# Patient Record
Sex: Female | Born: 1970 | Race: White | Hispanic: No | Marital: Married
Health system: Southern US, Community
[De-identification: ages and names within clinical notes are randomized; demographics above are authoritative.]

## PROBLEM LIST (undated history)

## (undated) DIAGNOSIS — I10 Essential (primary) hypertension: Secondary | ICD-10-CM

## (undated) HISTORY — PX: CHOLECYSTECTOMY: SHX55

---

## 2019-12-09 ENCOUNTER — Encounter (HOSPITAL_COMMUNITY): Payer: Self-pay

## 2019-12-09 ENCOUNTER — Emergency Department (HOSPITAL_COMMUNITY)
Admission: EM | Admit: 2019-12-09 | Discharge: 2019-12-09 | Disposition: A | Discharge status: Home / Self Care | Payer: 59 | Attending: Emergency Medicine | Admitting: Emergency Medicine

## 2019-12-09 ENCOUNTER — Other Ambulatory Visit: Payer: Self-pay

## 2019-12-09 ENCOUNTER — Emergency Department (HOSPITAL_COMMUNITY): Payer: 59

## 2019-12-09 DIAGNOSIS — I10 Essential (primary) hypertension: Secondary | ICD-10-CM | POA: Diagnosis not present

## 2019-12-09 DIAGNOSIS — S0990XA Unspecified injury of head, initial encounter: Secondary | ICD-10-CM | POA: Diagnosis present

## 2019-12-09 DIAGNOSIS — Y999 Unspecified external cause status: Secondary | ICD-10-CM | POA: Insufficient documentation

## 2019-12-09 DIAGNOSIS — Y929 Unspecified place or not applicable: Secondary | ICD-10-CM | POA: Diagnosis not present

## 2019-12-09 DIAGNOSIS — Z79899 Other long term (current) drug therapy: Secondary | ICD-10-CM | POA: Diagnosis not present

## 2019-12-09 DIAGNOSIS — M25512 Pain in left shoulder: Secondary | ICD-10-CM | POA: Diagnosis not present

## 2019-12-09 DIAGNOSIS — W108XXA Fall (on) (from) other stairs and steps, initial encounter: Secondary | ICD-10-CM | POA: Insufficient documentation

## 2019-12-09 DIAGNOSIS — S060X0A Concussion without loss of consciousness, initial encounter: Secondary | ICD-10-CM | POA: Insufficient documentation

## 2019-12-09 DIAGNOSIS — Y939 Activity, unspecified: Secondary | ICD-10-CM | POA: Diagnosis not present

## 2019-12-09 HISTORY — DX: Essential (primary) hypertension: I10

## 2019-12-09 NOTE — ED Triage Notes (Addendum)
Pt arrived via EMS from home after falling down six steps while carrying a printer onto gravel hitting her face. Hematoma noted to left forehead, pt denies loc or blod thinner use. C-collar in place. 1liter of ns given per EMS as their first bp recorded was 90 palp. Pt c/o left shoulder pain.

## 2019-12-09 NOTE — ED Provider Notes (Signed)
[facility] EMERGENCY DEPARTMENT Provider Note   CSN: [id number] Arrival date & time: 12/09/19  1716     History Chief Complaint  Patient presents with  . Fall    Lori Holt is a 49 y.o. female.  The history is provided by the patient. No language interpreter was used.  Fall This is a new problem. The current episode started 1 to 2 hours ago. The problem occurs constantly. The problem has not changed since onset.Associated symptoms include headaches. Nothing aggravates the symptoms. Nothing relieves the symptoms. She has tried nothing for the symptoms. The treatment provided no relief.   Pt reports she fell down the stairs and hit her head. No loss of consciousness. . Pt reports she was dizzy and nauseated with standing.      Past Medical History:  Diagnosis Date  . Hypertension     There are no problems to display for this patient.      OB History   No obstetric history on file.     No family history on file.  Social History   Tobacco Use  . Smoking status: Not on file  Substance Use Topics  . Alcohol use: Not on file  . Drug use: Not on file    Home Medications Prior to Admission medications   Medication Sig Start Date End Date Taking? Authorizing Provider  acetaminophen (TYLENOL) 325 MG tablet Take 650 mg by mouth every 6 (six) hours as needed for mild pain.   Yes [provider]  lisinopril (ZESTRIL) 5 MG tablet Take 5 mg by mouth daily. 11/10/19  Yes [provider]    Allergies    Escitalopram oxalate and Penicillins  Review of Systems   Review of Systems  Skin: Positive for wound.  Neurological: Positive for headaches.  All other systems reviewed and are negative.   Physical Exam Updated Vital Signs BP (!) 142/89   Pulse 75   Temp 97.7 F (36.5 C) (Oral)   Resp 14   Ht 5\' 3"  (1.6 m)   Wt 83.9 kg   SpO2 99%   BMI 32.77 kg/m   Physical Exam Vitals and nursing note reviewed.    Constitutional:      Appearance: She is well-developed.  HENT:     Head: Normocephalic.     Right Ear: Tympanic membrane normal.     Left Ear: Tympanic membrane normal.     Mouth/Throat:     Mouth: Mucous membranes are moist.  Eyes:     Pupils: Pupils are equal, round, and reactive to light.  Cardiovascular:     Rate and Rhythm: Normal rate.     Pulses: Normal pulses.  Pulmonary:     Effort: Pulmonary effort is normal.  Abdominal:     General: Abdomen is flat. There is no distension.  Musculoskeletal:        General: Swelling and tenderness present.     Cervical back: Normal range of motion.     Comments: Left shoulder diffusely tender  From nv and  ns intact   Skin:    General: Skin is warm.  Neurological:     General: No focal deficit present.     Mental Status: She is alert and oriented to person, place, and time.  Psychiatric:        Mood and Affect: Mood normal.     ED Results / Procedures / Treatments   Labs (all labs ordered are listed, but only abnormal results are displayed)  Labs Reviewed - No data to display  EKG None  Radiology CT Head Wo Contrast  Result Date: 12/09/2019 CLINICAL DATA:  Head trauma EXAM: CT HEAD WITHOUT CONTRAST TECHNIQUE: Contiguous axial images were obtained from the base of the skull through the vertex without intravenous contrast. COMPARISON:  None. FINDINGS: Brain: No acute intracranial abnormality. Specifically, no hemorrhage, hydrocephalus, mass lesion, acute infarction, or significant intracranial injury. Vascular: No hyperdense vessel or unexpected calcification. Skull: No acute calvarial abnormality. Sinuses/Orbits: Visualized paranasal sinuses and mastoids clear. Orbital soft tissues unremarkable. Other: None IMPRESSION: Normal study. Electronically Signed   By: Charlett Nose M.D.   On: 12/09/2019 18:43   CT Cervical Spine Wo Contrast  Result Date: 12/09/2019 CLINICAL DATA:  Neck trauma. EXAM: CT CERVICAL SPINE WITHOUT CONTRAST  TECHNIQUE: Multidetector CT imaging of the cervical spine was performed without intravenous contrast. Multiplanar CT image reconstructions were also generated. COMPARISON:  None. FINDINGS: Alignment: Normal Skull base and vertebrae: No acute fracture. No primary bone lesion or focal pathologic process. Soft tissues and spinal canal: No prevertebral fluid or swelling. No visible canal hematoma. Disc levels:  Disc space narrowing at C5-6. Upper chest: 3 mm posterior subpleural nodule in the right apex. Other: None IMPRESSION: No acute bony abnormality in the cervical spine. 3 mm subpleural nodule in the right apex. No follow-up needed if patient is low-risk. Non-contrast chest CT can be considered in 12 months if patient is high-risk. This recommendation follows the consensus statement: Guidelines for Management of Incidental Pulmonary Nodules Detected on CT Images: From the Fleischner Society 2017; Radiology 2017; 284:228-243. Electronically Signed   By: Charlett Nose M.D.   On: 12/09/2019 18:46   DG Shoulder Left  Result Date: 12/09/2019 CLINICAL DATA:  Fall, left shoulder pain EXAM: LEFT SHOULDER - 2+ VIEW COMPARISON:  None. FINDINGS: Early degenerative changes in the left [facility] joint with joint space narrowing and spurring. Glenohumeral joint is maintained. No acute bony abnormality. Specifically, no fracture, subluxation, or dislocation. 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 No acute bony abnormality. Electronically Signed   By: Charlett Nose M.D.   On: 12/09/2019 19:03    Procedures Procedures (including critical care time)  Medications Ordered in ED Medications - No data to display  ED Course  I have reviewed the triage vital signs and the nursing notes.  Pertinent labs & imaging results that were available during my care of the patient were reviewed by me and considered in my medical decision making (see chart for details).    MDM Rules/Calculators/A&P                           MDM:  Ct head and c spine negative,  Shoulder arthritis no fracture  Final Clinical Impression(s) / ED Diagnoses Final diagnoses:  Concussion without loss of consciousness, initial encounter  Acute pain of left shoulder    Rx / DC Orders ED Discharge Orders    None    An After Visit Summary was printed and given to the patient.    Osie Cheeks 12/09/19 1954    Pollyann Savoy, MD 12/09/19 2159

## 2019-12-09 NOTE — Discharge Instructions (Signed)
Return if any problems.  See your Physician for recheck  °

## 2020-12-25 IMAGING — CT CT CERVICAL SPINE W/O CM
3 of 4 series · 13 of 33 positions shown, 16 images · non-contrast
Comparison: None.

CLINICAL DATA: Neck trauma.

EXAM:
CT CERVICAL SPINE WITHOUT CONTRAST
TECHNIQUE: Multidetector CT imaging of the cervical spine was performed without
intravenous contrast. Multiplanar CT image reconstructions were also
generated.

[Series 4: c_spine 2.0 st · axial · 0.34mm/px · z∈[-345,-219]mm · 5 of 95 slices shown, 7 images]
[im 16/95  soft-tissue]
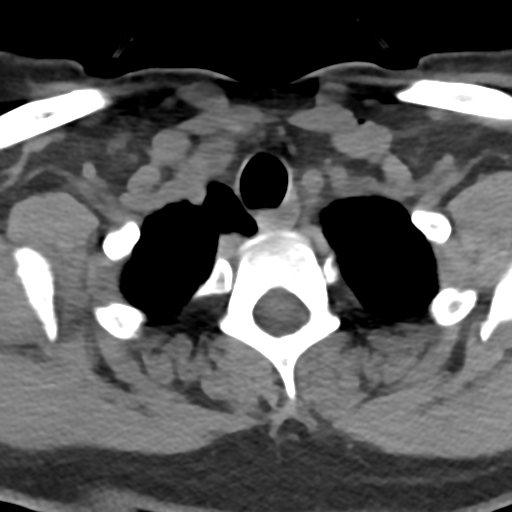
[im 16/95  bone]
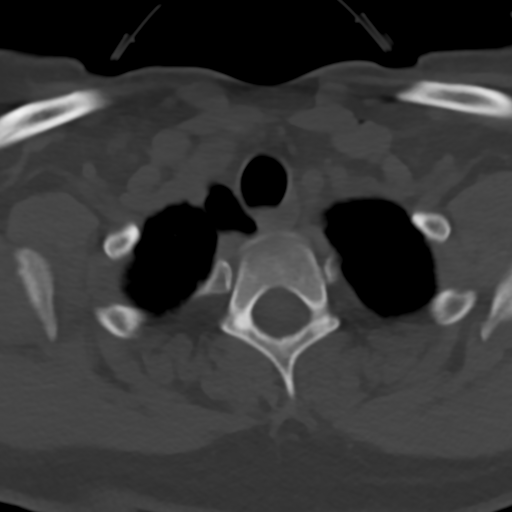
[im 32/95  bone]
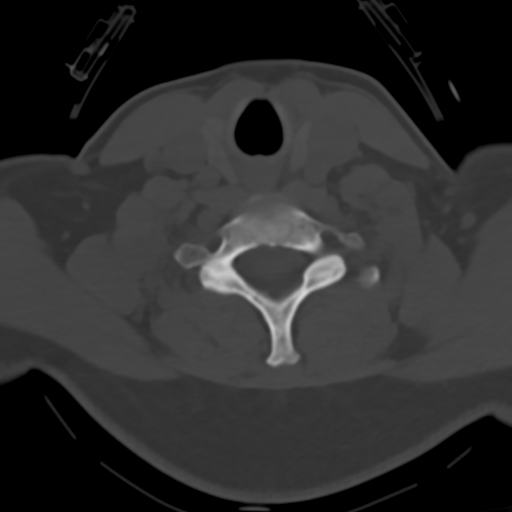
[im 48/95  bone]
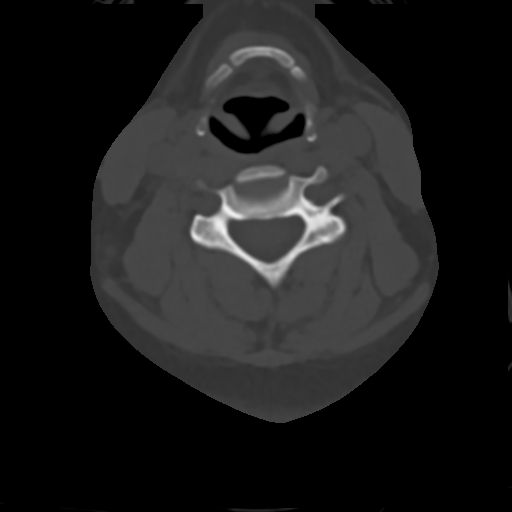
[im 63/95  bone]
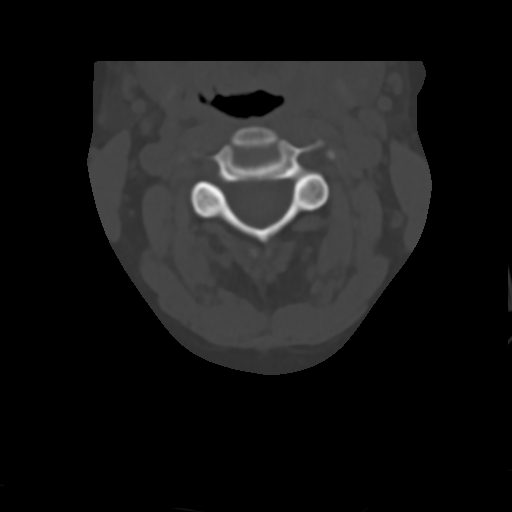
[im 79/95  soft-tissue]
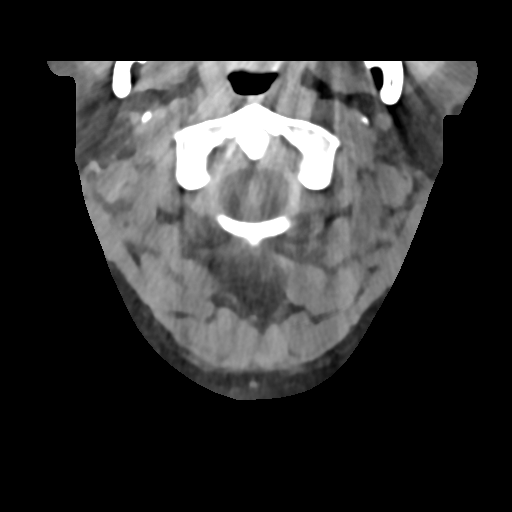
[im 79/95  bone]
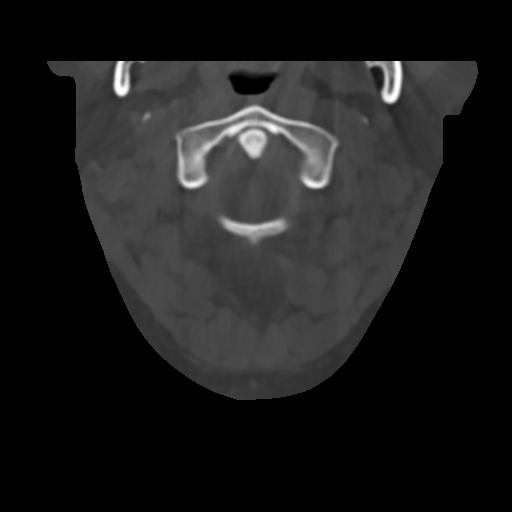

[Series 9: c_spine 2.0 sag bone · sagittal · 0.34mm/px · 5 of 70 slices shown, 6 images]
[im 24/70  bone]
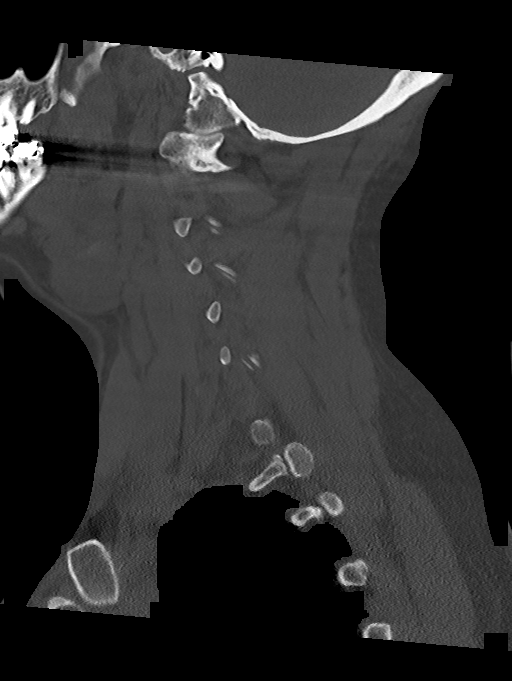
[im 29/70  bone]
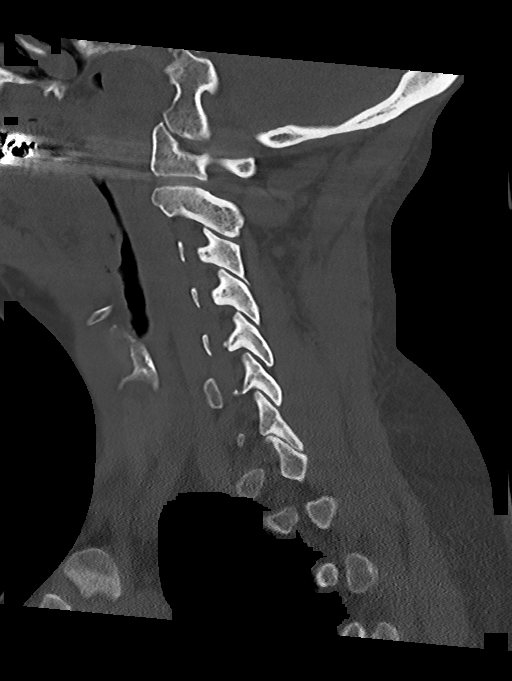
[im 35/70  soft-tissue]
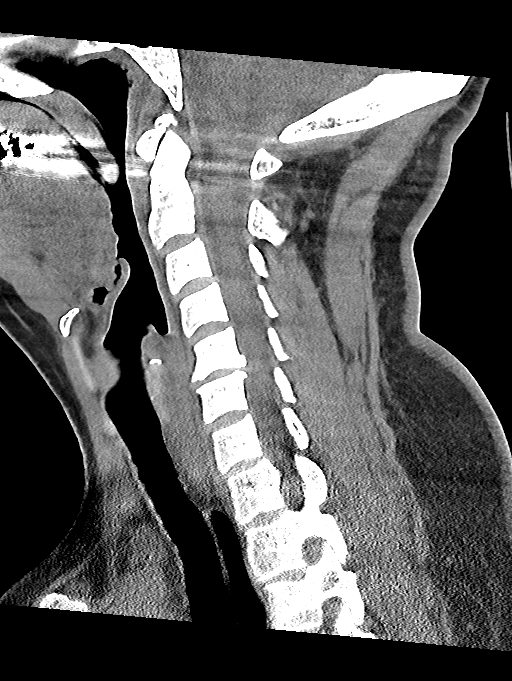
[im 35/70  bone]
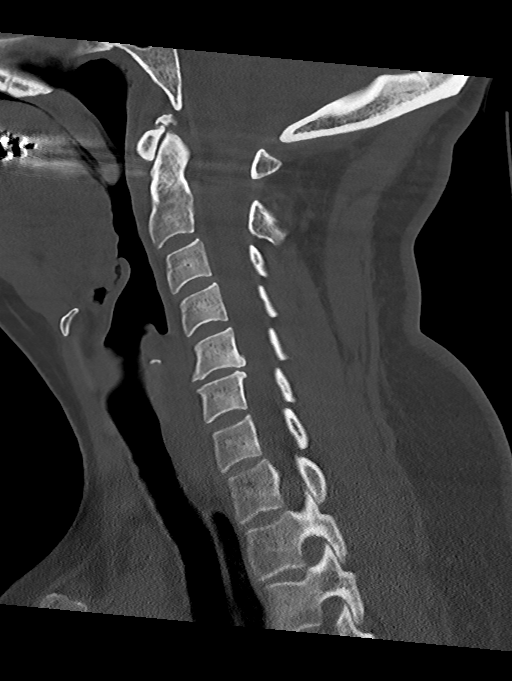
[im 41/70  bone]
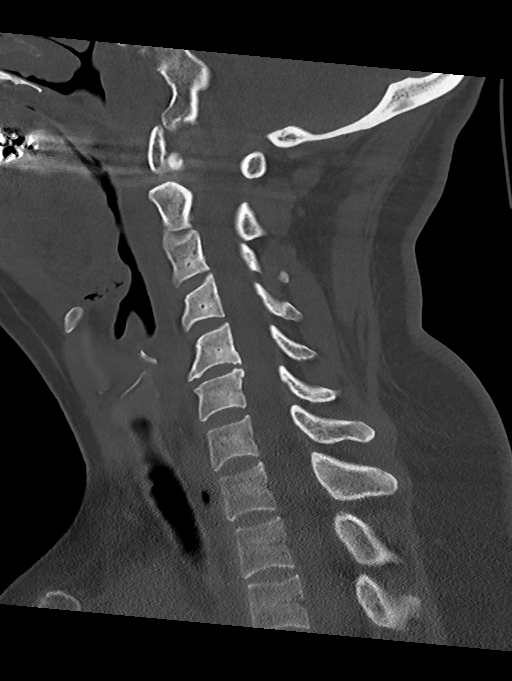
[im 47/70  bone]
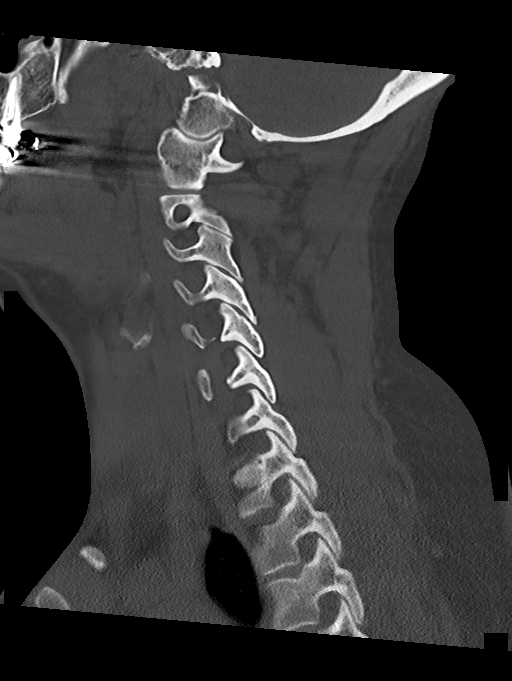

[Series 10: c_spine 2.0 cor bone · coronal · 0.34mm/px · 3 of 92 slices shown]
[im 19/92  bone]
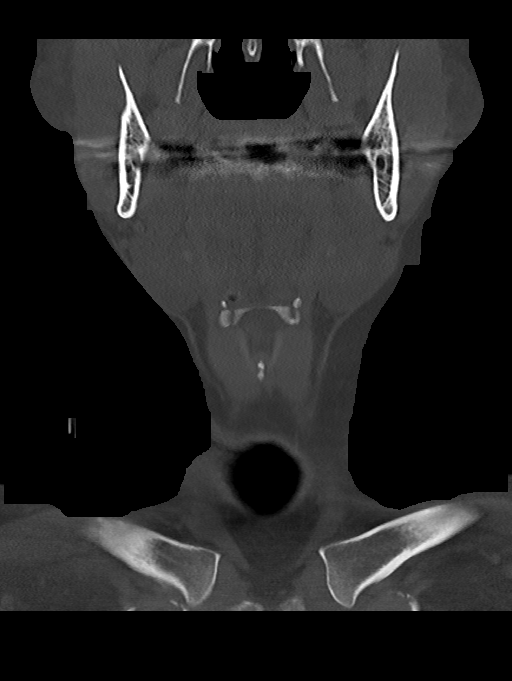
[im 37/92  bone]
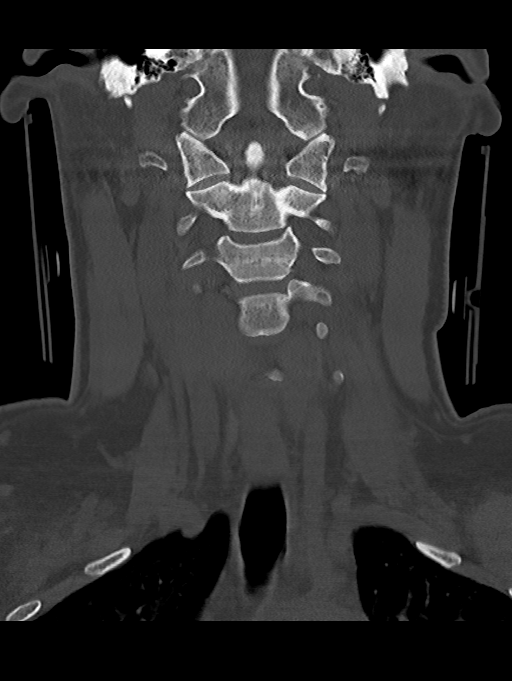
[im 55/92  bone]
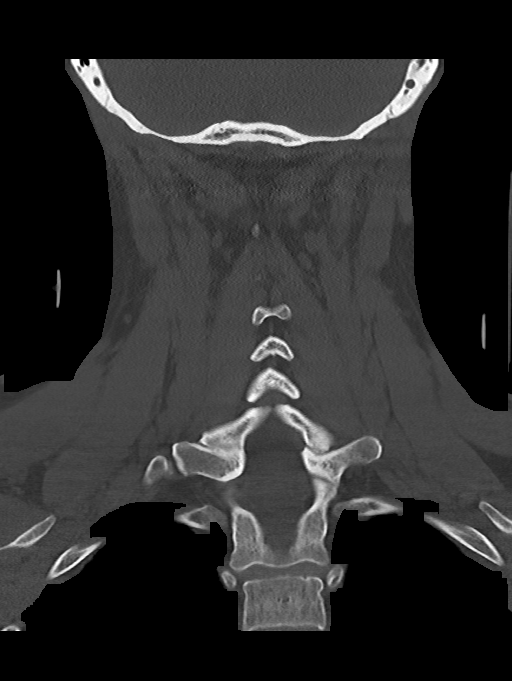

[13 of 33 positions shown; findings below may reference images not displayed]

FINDINGS: Alignment: Normal

Skull base and vertebrae: No acute fracture. No primary bone lesion
or focal pathologic process.

Soft tissues and spinal canal: No prevertebral fluid or swelling. No
visible canal hematoma.

Disc levels:  Disc space narrowing at C5-6.

Upper chest: 3 mm posterior subpleural nodule in the right apex.

Other: None
IMPRESSION: No acute bony abnormality in the cervical spine.

3 mm subpleural nodule in the right apex. No follow-up needed if
patient is low-risk. Non-contrast chest CT can be considered in 12
months if patient is high-risk. This recommendation follows the
consensus statement: Guidelines for Management of Incidental
Pulmonary Nodules Detected on CT Images: From the [HOSPITAL]

## 2020-12-25 IMAGING — CR DG SHOULDER 2+V*L*
2 series · 2 of 2 positions shown · non-contrast
Comparison: None.

CLINICAL DATA: Fall, left shoulder pain

EXAM:
LEFT SHOULDER - 2+ VIEW

[shoulder grashey]
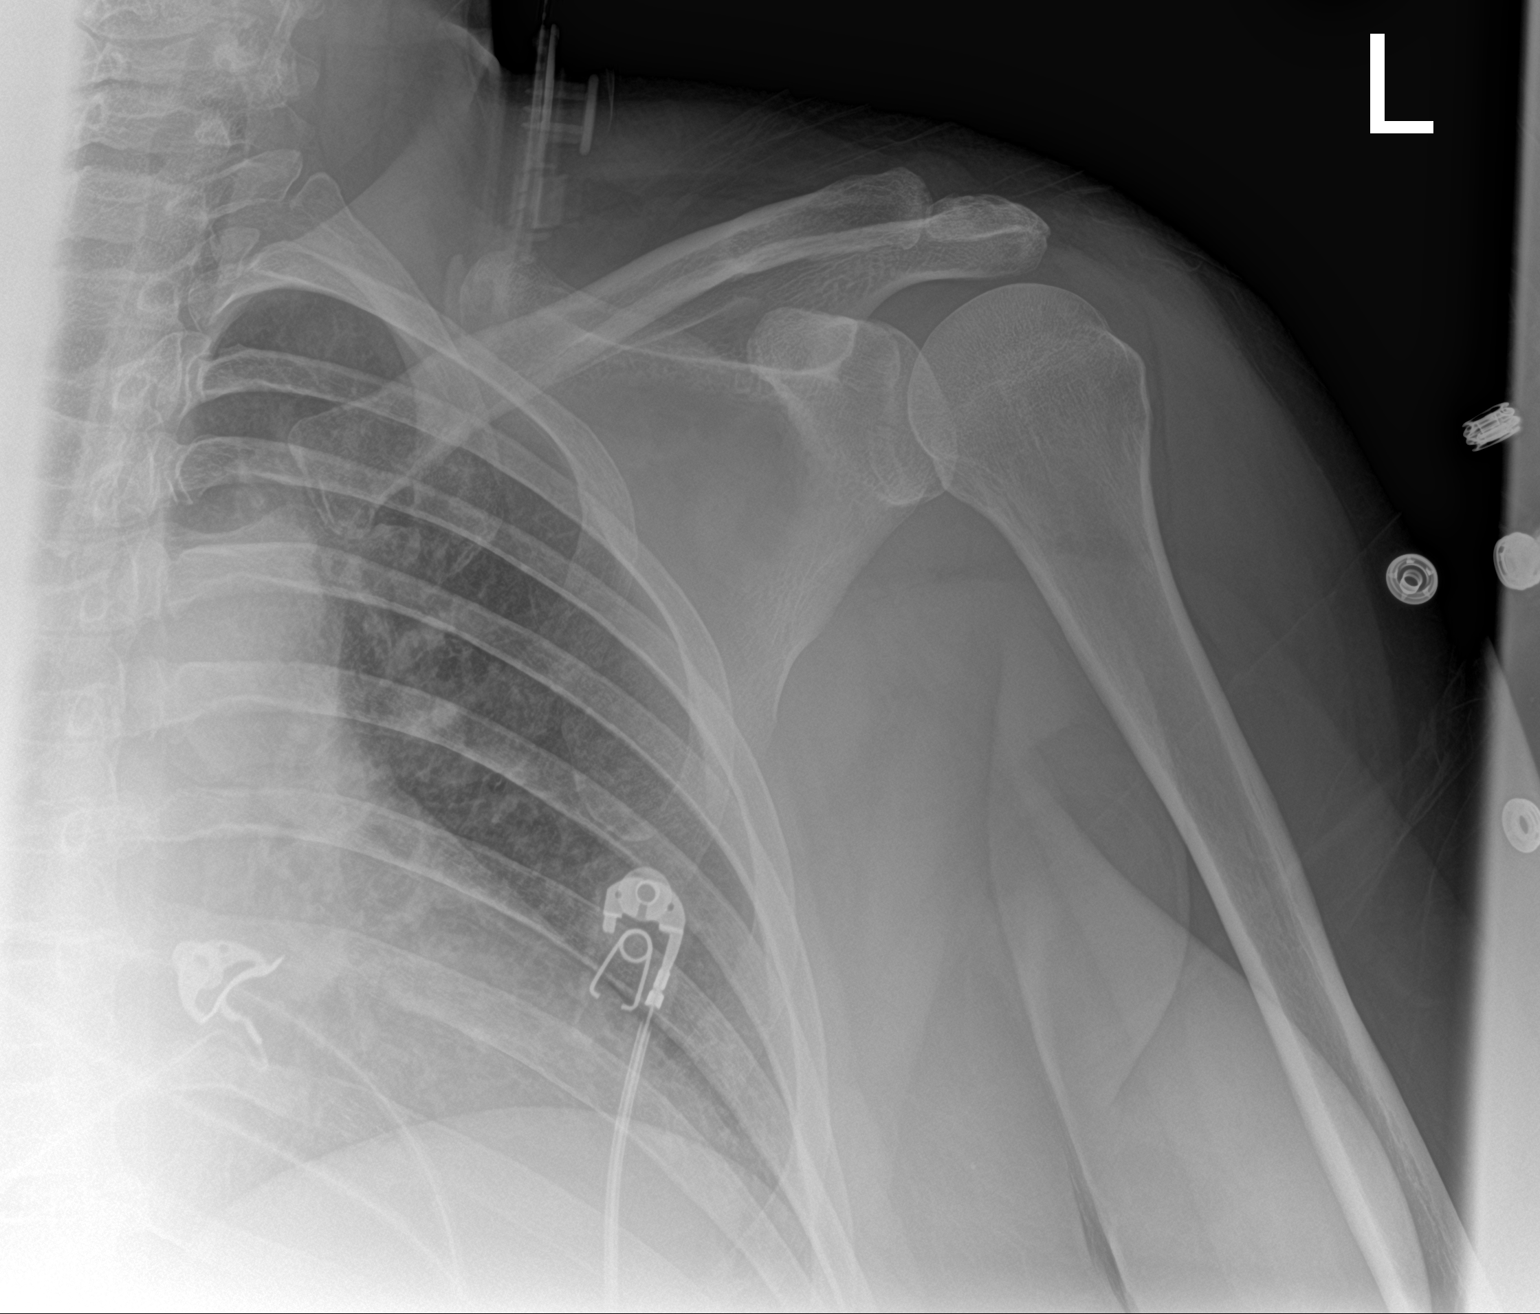

[shoulder y view]
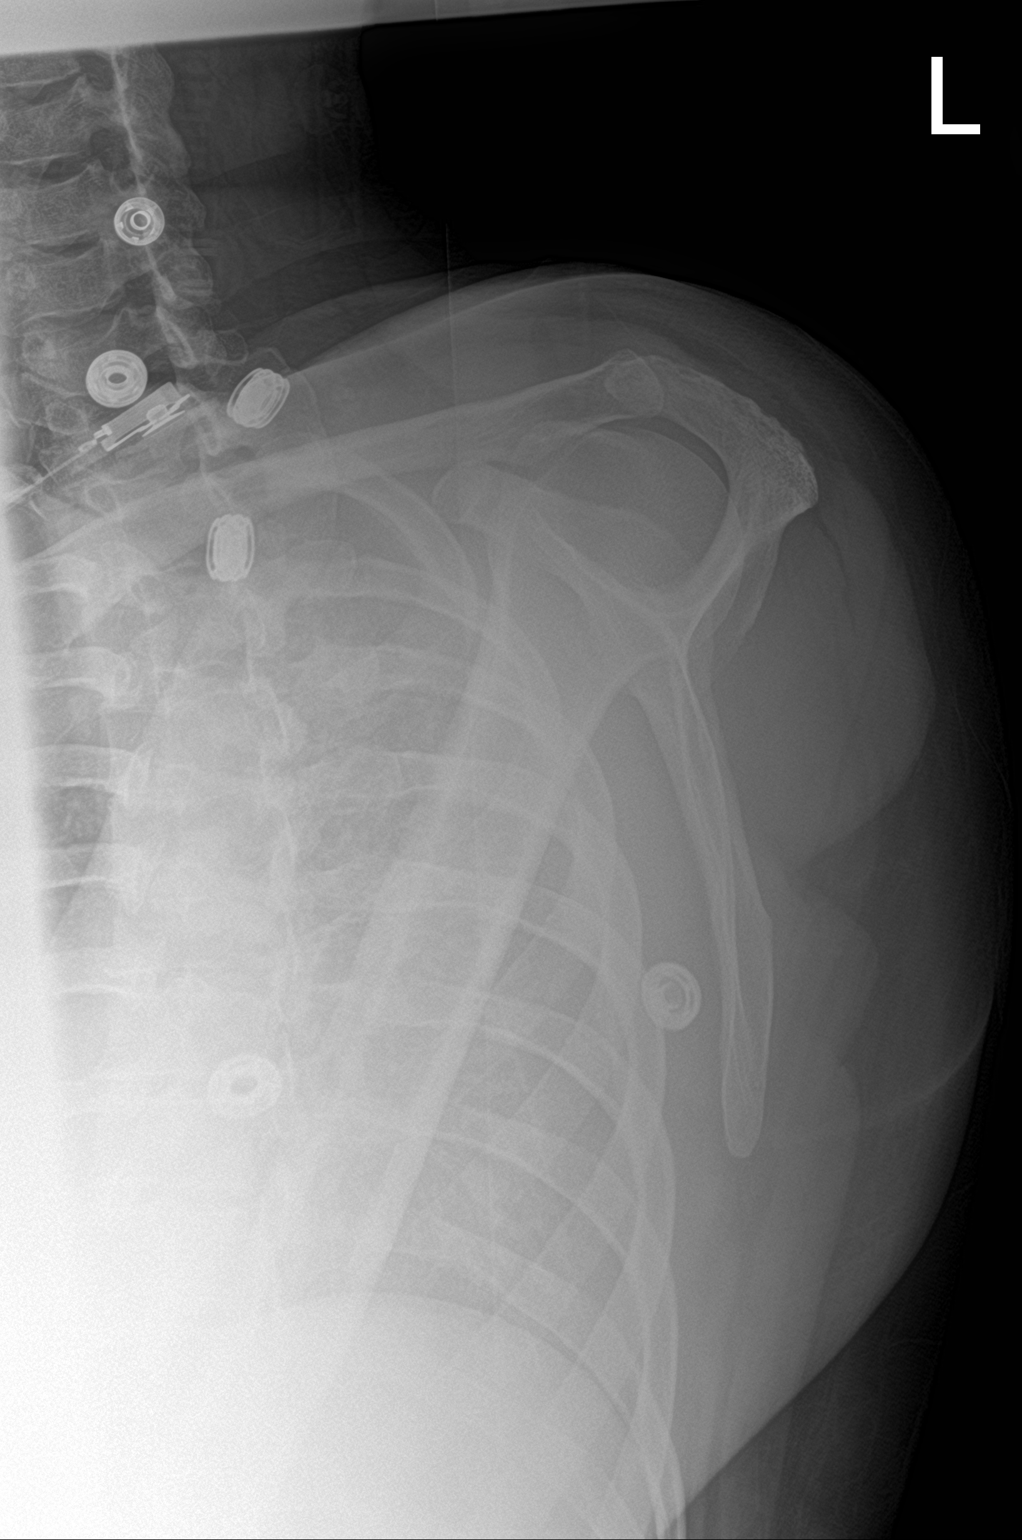

[2 of 2 positions shown; findings below may reference images not displayed]

FINDINGS: Early degenerative changes in the left AC joint with joint space
narrowing and spurring. Glenohumeral joint is maintained. No acute
bony abnormality. Specifically, no fracture, subluxation, or
dislocation.
IMPRESSION: Early osteoarthritis in the left AC joint. No acute bony
abnormality.

## 2023-03-29 ENCOUNTER — Other Ambulatory Visit: Payer: Self-pay

## 2023-03-29 ENCOUNTER — Emergency Department (HOSPITAL_BASED_OUTPATIENT_CLINIC_OR_DEPARTMENT_OTHER)
Admission: EM | Admit: 2023-03-29 | Discharge: 2023-03-30 | Disposition: A | Discharge status: Home / Self Care | Payer: Managed Care, Other (non HMO) | Attending: Emergency Medicine | Admitting: Emergency Medicine

## 2023-03-29 ENCOUNTER — Encounter (HOSPITAL_BASED_OUTPATIENT_CLINIC_OR_DEPARTMENT_OTHER): Payer: Self-pay

## 2023-03-29 ENCOUNTER — Emergency Department (HOSPITAL_BASED_OUTPATIENT_CLINIC_OR_DEPARTMENT_OTHER): Payer: Managed Care, Other (non HMO)

## 2023-03-29 DIAGNOSIS — R1084 Generalized abdominal pain: Secondary | ICD-10-CM | POA: Diagnosis not present

## 2023-03-29 DIAGNOSIS — Z79899 Other long term (current) drug therapy: Secondary | ICD-10-CM | POA: Diagnosis not present

## 2023-03-29 DIAGNOSIS — I10 Essential (primary) hypertension: Secondary | ICD-10-CM | POA: Diagnosis not present

## 2023-03-29 DIAGNOSIS — N838 Other noninflammatory disorders of ovary, fallopian tube and broad ligament: Secondary | ICD-10-CM

## 2023-03-29 DIAGNOSIS — R1011 Right upper quadrant pain: Secondary | ICD-10-CM | POA: Insufficient documentation

## 2023-03-29 DIAGNOSIS — N839 Noninflammatory disorder of ovary, fallopian tube and broad ligament, unspecified: Secondary | ICD-10-CM | POA: Insufficient documentation

## 2023-03-29 LAB — COMPREHENSIVE METABOLIC PANEL
ALT: 21 U/L (ref 0–44)
AST: 25 U/L (ref 15–41)
Albumin: 4 g/dL (ref 3.5–5.0)
Alkaline Phosphatase: 91 U/L (ref 38–126)
Anion gap: 9 (ref 5–15)
BUN: 10 mg/dL (ref 6–20)
CO2: 23 mmol/L (ref 22–32)
Calcium: 9 mg/dL (ref 8.9–10.3)
Chloride: 104 mmol/L (ref 98–111)
Creatinine, Ser: 0.98 mg/dL (ref 0.44–1.00)
GFR, Estimated: >60 mL/min (ref >60)
Glucose, Bld: 134 mg/dL — ABNORMAL HIGH (ref 70–99)
Potassium: 3.4 mmol/L — ABNORMAL LOW (ref 3.5–5.1)
Sodium: 136 mmol/L (ref 135–145)
Total Bilirubin: 0.7 mg/dL (ref <1.2)
Total Protein: 7.2 g/dL (ref 6.5–8.1)

## 2023-03-29 LAB — URINALYSIS, ROUTINE W REFLEX MICROSCOPIC
Bilirubin Urine: NEGATIVE
Glucose, UA: NEGATIVE mg/dL
Hgb urine dipstick: NEGATIVE
Ketones, ur: 40 mg/dL — AB
Leukocytes,Ua: NEGATIVE
Nitrite: NEGATIVE
Protein, ur: 30 mg/dL — AB
Specific Gravity, Urine: 1.02 (ref 1.005–1.030)
pH: >=9 (ref 5.0–8.0)

## 2023-03-29 LAB — LIPASE, BLOOD: Lipase: 34 U/L (ref 11–51)

## 2023-03-29 LAB — URINALYSIS, MICROSCOPIC (REFLEX)

## 2023-03-29 LAB — CBC
HCT: 40.9 % (ref 36.0–46.0)
Hemoglobin: 13.7 g/dL (ref 12.0–15.0)
MCH: 28.5 pg (ref 26.0–34.0)
MCHC: 33.5 g/dL (ref 30.0–36.0)
MCV: 85 fL (ref 80.0–100.0)
Platelets: 257 10*3/uL (ref 150–400)
RBC: 4.81 MIL/uL (ref 3.87–5.11)
RDW: 12.6 % (ref 11.5–15.5)
WBC: 11.5 10*3/uL — ABNORMAL HIGH (ref 4.0–10.5)
nRBC: 0 % (ref 0.0–0.2)

## 2023-03-29 LAB — PREGNANCY, URINE: Preg Test, Ur: NEGATIVE

## 2023-03-29 MED ORDER — SODIUM CHLORIDE 0.9 % IV BOLUS
500.0000 mL | Freq: Once | INTRAVENOUS | Status: AC
  Administered 2023-03-30: 500 mL via INTRAVENOUS

## 2023-03-29 MED ORDER — ONDANSETRON HCL 4 MG/2ML IJ SOLN: 4.0000 mg | Freq: Once | INTRAMUSCULAR | Status: DC | PRN

## 2023-03-29 MED ORDER — KETOROLAC TROMETHAMINE 15 MG/ML IJ SOLN
15.0000 mg | Freq: Once | INTRAMUSCULAR | Status: AC
  Administered 2023-03-30: 15 mg via INTRAVENOUS
  Filled 2023-03-29: qty 1

## 2023-03-29 MED ORDER — DICYCLOMINE HCL 10 MG PO CAPS
20.0000 mg | ORAL_CAPSULE | Freq: Once | ORAL | Status: AC
  Administered 2023-03-29: 20 mg via ORAL
  Filled 2023-03-29: qty 2

## 2023-03-29 MED ORDER — LACTATED RINGERS IV BOLUS
1000.0000 mL | Freq: Once | INTRAVENOUS | Status: AC
  Administered 2023-03-29: 1000 mL via INTRAVENOUS

## 2023-03-29 MED ORDER — FAMOTIDINE IN NACL 20-0.9 MG/50ML-% IV SOLN
20.0000 mg | Freq: Once | INTRAVENOUS | Status: AC
  Administered 2023-03-29: 20 mg via INTRAVENOUS
  Filled 2023-03-29: qty 50

## 2023-03-29 MED ORDER — ONDANSETRON HCL 4 MG/2ML IJ SOLN
4.0000 mg | Freq: Once | INTRAMUSCULAR | Status: AC
  Administered 2023-03-29: 4 mg via INTRAVENOUS
  Filled 2023-03-29: qty 2

## 2023-03-29 MED ORDER — IOHEXOL 300 MG/ML  SOLN
100.0000 mL | Freq: Once | INTRAMUSCULAR | Status: AC | PRN
  Administered 2023-03-29: 100 mL via INTRAVENOUS

## 2023-03-29 MED ORDER — ACETAMINOPHEN 500 MG PO TABS
1000.0000 mg | ORAL_TABLET | Freq: Once | ORAL | Status: AC
  Administered 2023-03-29: 1000 mg via ORAL
  Filled 2023-03-29: qty 2

## 2023-03-29 MED ORDER — METOCLOPRAMIDE HCL 5 MG/ML IJ SOLN
10.0000 mg | Freq: Once | INTRAMUSCULAR | Status: AC
  Administered 2023-03-29: 10 mg via INTRAVENOUS
  Filled 2023-03-29: qty 2

## 2023-03-29 MED ORDER — DIPHENHYDRAMINE HCL 25 MG PO CAPS
25.0000 mg | ORAL_CAPSULE | Freq: Once | ORAL | Status: AC
  Administered 2023-03-29: 25 mg via ORAL
  Filled 2023-03-29: qty 1

## 2023-03-29 NOTE — ED Provider Notes (Incomplete)
[facility] Provider Note   CSN: [id number] Arrival date & time: 03/29/23  2109     History {Add pertinent medical, surgical, social history, OB history to HPI:1} Chief Complaint  Patient presents with  . Abdominal Pain    Lori Holt is a 52 y.o. female.   Abdominal Pain      Home Medications Prior to Admission medications   Medication Sig Start Date End Date Taking? Authorizing Provider  acetaminophen (TYLENOL) 325 MG tablet Take 650 mg by mouth every 6 (six) hours as needed for mild pain.    [provider]  lisinopril (ZESTRIL) 5 MG tablet Take 5 mg by mouth daily. 11/10/19   [provider]      Allergies    Escitalopram oxalate and Penicillins    Review of Systems   Review of Systems  Gastrointestinal:  Positive for abdominal pain.    Physical Exam Updated Vital Signs BP 125/73   Pulse (!) 111   Temp 99.4 F (37.4 C) (Oral)   Resp 18   Wt 80.3 kg   SpO2 93%   BMI 31.35 kg/m  Physical Exam  ED Results / Procedures / Treatments   Labs (all labs ordered are listed, but only abnormal results are displayed) Labs Reviewed  CBC - Abnormal; Notable for the following components:      Result Value   WBC 11.5 (*)    All other components within normal limits  URINALYSIS, ROUTINE W REFLEX MICROSCOPIC - Abnormal; Notable for the following components:   APPearance HAZY (*)    Ketones, ur 40 (*)    Protein, ur 30 (*)    All other components within normal limits  COMPREHENSIVE METABOLIC PANEL - Abnormal; Notable for the following components:   Potassium 3.4 (*)    Glucose, Bld 134 (*)    All other components within normal limits  URINALYSIS, MICROSCOPIC (REFLEX) - Abnormal; Notable for the following components:   Bacteria, UA FEW (*)    All other components within normal limits  PREGNANCY, URINE  LIPASE, BLOOD    EKG None  Radiology CT ABDOMEN PELVIS W CONTRAST Result Date:  03/29/2023 CLINICAL DATA:  Abdominal pain with nausea, vomiting, decreased appetite and chills x2 days. EXAM: CT ABDOMEN AND PELVIS WITH CONTRAST TECHNIQUE: Multidetector CT imaging of the abdomen and pelvis was performed using the standard protocol following bolus administration of intravenous contrast. RADIATION DOSE REDUCTION: This exam was performed according to the departmental dose-optimization program which includes automated exposure control, adjustment of the mA and/or kV according to patient size and/or use of iterative reconstruction technique. CONTRAST:  OMNIPAQUE IOHEXOL 300 MG/ML  SOLN COMPARISON:  March 15, 2023 FINDINGS: Lower chest: No acute abnormality. Hepatobiliary: No focal liver abnormality is seen. Status post cholecystectomy. No biliary dilatation. Pancreas: Unremarkable. No pancreatic ductal dilatation or surrounding inflammatory changes. Spleen: Normal in size without focal abnormality. Adrenals/Urinary Tract: Adrenal glands are unremarkable. Kidneys are normal, without obstructing renal calculi, focal lesion, or hydronephrosis. A stable 1 mm nonobstructing renal calculus is seen within the mid left kidney. A 2 mm nonobstructing renal calculus is noted within the lower pole of the right kidney. Bladder is unremarkable. Stomach/Bowel: Stomach is within normal limits. Appendix appears normal. No evidence of bowel wall thickening, distention, or inflammatory changes. Vascular/Lymphatic: No significant vascular findings are present. No enlarged abdominal or pelvic lymph nodes. Reproductive: The uterus and right adnexa are unremarkable. A stable 3.3 cm x 2.8 cm  simple left ovarian cyst is seen. Other: No abdominal wall hernia or abnormality. No abdominopelvic ascites. Musculoskeletal: Moderate severity degenerative changes seen at the level of L5-S1. IMPRESSION: 1. Bilateral nonobstructing renal calculi. 2. Stable simple left ovarian cyst. No follow-up imaging is recommended. This  recommendation follows ACR consensus guidelines: White Paper of the ACR Incidental Findings Committee II on Adnexal Findings. J Am Coll Radiol 2013:10:675-681. 3. Evidence of prior cholecystectomy. Electronically Signed   By: Aram Candela M.D.   On: 03/29/2023 23:48    Procedures Procedures  {Document cardiac monitor, telemetry assessment procedure when appropriate:1}  Medications Ordered in ED Medications  metoCLOPramide (REGLAN) injection 10 mg (10 mg Intravenous Given 03/29/23 2227)  diphenhydrAMINE (BENADRYL) capsule 25 mg (25 mg Oral Given 03/29/23 2228)  lactated ringers bolus 1,000 mL (1,000 mLs Intravenous New Bag/Given 03/29/23 2226)  famotidine (PEPCID) IVPB 20 mg premix (0 mg Intravenous Stopped 03/29/23 2256)  dicyclomine (BENTYL) capsule 20 mg (20 mg Oral Given 03/29/23 2227)  acetaminophen (TYLENOL) tablet 1,000 mg (1,000 mg Oral Given 03/29/23 2227)  iohexol (OMNIPAQUE) 300 MG/ML solution 100 mL (100 mLs Intravenous Contrast Given 03/29/23 2326)  ondansetron (ZOFRAN) injection 4 mg (4 mg Intravenous Given 03/29/23 2309)    ED Course/ Medical Decision Making/ A&P Clinical Course as of 03/29/23 2352  Thu Mar 29, 2023  2207 RUQ abd pain since 7pm; chills since 4pm.  Nausea w NBNB emesis.   [WF]  2210 TG ate a burger and has had some indigestion since then.  [WF]  2213 4pm last PO [WF]  2215 Watery diarrhea x1 today [WF]    Clinical Course User Index [WF] Gailen Shelter, PA   {   Click here for ABCD2, HEART and other calculatorsREFRESH Note before signing :1}                              Medical Decision Making Amount and/or Complexity of Data Reviewed Labs: ordered. Radiology: ordered.  Risk OTC drugs. Prescription drug management.   ***  {Document critical care time when appropriate:1} {Document review of labs and clinical decision tools ie heart score, Chads2Vasc2 etc:1}  {Document your independent review of radiology images, and any outside  records:1} {Document your discussion with family members, caretakers, and with consultants:1} {Document social determinants of health affecting pt's care:1} {Document your decision making why or why not admission, treatments were needed:1} Final Clinical Impression(s) / ED Diagnoses Final diagnoses:  None    Rx / DC Orders ED Discharge Orders     None

## 2023-03-29 NOTE — ED Notes (Signed)
Patient transported to CT 

## 2023-03-29 NOTE — ED Triage Notes (Signed)
Pt arrives with c/o ABD pain that started in the last 2 days. Pt reports n/v, decreased appetite, and chills. Pt reports pain is mostly in her RUQ.

## 2023-03-29 NOTE — ED Notes (Signed)
Patient ambulatory to the BR to provide a UA

## 2023-03-29 NOTE — ED Provider Notes (Signed)
[facility] Provider Note   CSN: [id number] Arrival date & time: 03/29/23  2109     History  Chief Complaint  Patient presents with   Abdominal Pain    Janora Diskin is a 52 y.o. female.   Abdominal Pain  Patient is a 52 year old female with past medical history significant for hypertension, status post cholecystectomy  She presents emergency room today with complaints of right upper quadrant abdominal pain since 7 PM.  She states she developed some chills around 4 PM and developed nausea and had several episodes of nonbloody nonbilious emesis.  She states she has had some indigestion since Thanksgiving when she had a burger.  She states her last p.o. intake was 4 PM today.  She has had some watery diarrhea today but states only 1 episode.  She states she has had some looser stool over the past few weeks to months.  She denies any chest pain or dyspnea.  No syncope or near syncope.  She has some generalized aches and feels generally unwell.  She denies any cough and states that her abdominal pain is not worse with deep inhalation.  She denies any lower extremity swelling or history of VTE no hemoptysis.     Home Medications Prior to Admission medications   Medication Sig Start Date End Date Taking? Authorizing Provider  acetaminophen (TYLENOL) 500 MG tablet Take 2 tablets (1,000 mg total) by mouth every 6 (six) hours as needed. 03/30/23  Yes Jerzi Tigert S, PA  dicyclomine (BENTYL) 20 MG tablet Take 1 tablet (20 mg total) by mouth 2 (two) times daily. 03/30/23  Yes Sheila Gervasi S, PA  famotidine (PEPCID) 20 MG tablet Take 1 tablet (20 mg total) by mouth daily. 03/30/23  Yes Marilena Trevathan S, PA  ondansetron (ZOFRAN-ODT) 4 MG disintegrating tablet Take 1 tablet (4 mg total) by mouth every 8 (eight) hours as needed for nausea or vomiting. 03/30/23  Yes Kamarie Veno S, PA  lisinopril (ZESTRIL) 5 MG tablet Take 5 mg by mouth  daily. 11/10/19   [provider]      Allergies    Escitalopram oxalate and Penicillins    Review of Systems   Review of Systems  Gastrointestinal:  Positive for abdominal pain.    Physical Exam Updated Vital Signs BP 112/72   Pulse 100   Temp 99.4 F (37.4 C) (Oral)   Resp 18   Wt 80.3 kg   SpO2 95%   BMI 31.35 kg/m  Physical Exam Vitals and nursing note reviewed.  Constitutional:      Comments: Fatigued appearing 52 year old female appears nontoxic but unwell.  HENT:     Head: Normocephalic and atraumatic.     Nose: Nose normal.  Eyes:     General: No scleral icterus. Cardiovascular:     Rate and Rhythm: Regular rhythm. Tachycardia present.     Pulses: Normal pulses.     Heart sounds: Normal heart sounds.  Pulmonary:     Effort: Pulmonary effort is normal. No respiratory distress.     Breath sounds: No wheezing.     Comments: No focal crackles or wheezing, speaking in full sentences Abdominal:     Palpations: Abdomen is soft.     Tenderness: There is abdominal tenderness in the right upper quadrant, right lower quadrant and periumbilical area.  Musculoskeletal:     Cervical back: Normal range of motion.     Right lower leg: No edema.  Left lower leg: No edema.  Skin:    General: Skin is warm and dry.     Capillary Refill: Capillary refill takes less than 2 seconds.  Neurological:     Mental Status: She is alert. Mental status is at baseline.  Psychiatric:        Mood and Affect: Mood normal.        Behavior: Behavior normal.     ED Results / Procedures / Treatments   Labs (all labs ordered are listed, but only abnormal results are displayed) Labs Reviewed  CBC - Abnormal; Notable for the following components:      Result Value   WBC 11.5 (*)    All other components within normal limits  URINALYSIS, ROUTINE W REFLEX MICROSCOPIC - Abnormal; Notable for the following components:   APPearance HAZY (*)    Ketones, ur 40 (*)    Protein, ur  30 (*)    All other components within normal limits  COMPREHENSIVE METABOLIC PANEL - Abnormal; Notable for the following components:   Potassium 3.4 (*)    Glucose, Bld 134 (*)    All other components within normal limits  URINALYSIS, MICROSCOPIC (REFLEX) - Abnormal; Notable for the following components:   Bacteria, UA FEW (*)    All other components within normal limits  PREGNANCY, URINE  LIPASE, BLOOD    EKG None  Radiology CT ABDOMEN PELVIS W CONTRAST Result Date: 03/29/2023 CLINICAL DATA:  Abdominal pain with nausea, vomiting, decreased appetite and chills x2 days. EXAM: CT ABDOMEN AND PELVIS WITH CONTRAST TECHNIQUE: Multidetector CT imaging of the abdomen and pelvis was performed using the standard protocol following bolus administration of intravenous contrast. RADIATION DOSE REDUCTION: This exam was performed according to the departmental dose-optimization program which includes automated exposure control, adjustment of the mA and/or kV according to patient size and/or use of iterative reconstruction technique. CONTRAST:  OMNIPAQUE IOHEXOL 300 MG/ML  SOLN COMPARISON:  March 15, 2023 FINDINGS: Lower chest: No acute abnormality. Hepatobiliary: No focal liver abnormality is seen. Status post cholecystectomy. No biliary dilatation. Pancreas: Unremarkable. No pancreatic ductal dilatation or surrounding inflammatory changes. Spleen: Normal in size without focal abnormality. Adrenals/Urinary Tract: Adrenal glands are unremarkable. Kidneys are normal, without obstructing renal calculi, focal lesion, or hydronephrosis. A stable 1 mm nonobstructing renal calculus is seen within the mid left kidney. A 2 mm nonobstructing renal calculus is noted within the lower pole of the right kidney. Bladder is unremarkable. Stomach/Bowel: Stomach is within normal limits. Appendix appears normal. No evidence of bowel wall thickening, distention, or inflammatory changes. Vascular/Lymphatic: No significant  vascular findings are present. No enlarged abdominal or pelvic lymph nodes. Reproductive: The uterus and right adnexa are unremarkable. A stable 3.3 cm x 2.8 cm simple left ovarian cyst is seen. Other: No abdominal wall hernia or abnormality. No abdominopelvic ascites. Musculoskeletal: Moderate severity degenerative changes seen at the level of L5-S1. IMPRESSION: 1. Bilateral nonobstructing renal calculi. 2. Stable simple left ovarian cyst. No follow-up imaging is recommended. This recommendation follows ACR consensus guidelines: White Paper of the ACR Incidental Findings Committee II on Adnexal Findings. J Am Coll Radiol 2013:10:675-681. 3. Evidence of prior cholecystectomy. Electronically Signed   By: Aram Candela M.D.   On: 03/29/2023 23:48    Procedures Procedures    Medications Ordered in ED Medications  ondansetron (ZOFRAN) injection 4 mg (has no administration in time range)  metoCLOPramide (REGLAN) injection 10 mg (10 mg Intravenous Given 03/29/23 2227)  diphenhydrAMINE (BENADRYL) capsule 25 mg (  25 mg Oral Given 03/29/23 2228)  lactated ringers bolus 1,000 mL (0 mLs Intravenous Stopped 03/30/23 0012)  famotidine (PEPCID) IVPB 20 mg premix (0 mg Intravenous Stopped 03/29/23 2256)  dicyclomine (BENTYL) capsule 20 mg (20 mg Oral Given 03/29/23 2227)  acetaminophen (TYLENOL) tablet 1,000 mg (1,000 mg Oral Given 03/29/23 2227)  iohexol (OMNIPAQUE) 300 MG/ML solution 100 mL (100 mLs Intravenous Contrast Given 03/29/23 2326)  ondansetron (ZOFRAN) injection 4 mg (4 mg Intravenous Given 03/29/23 2309)  ketorolac (TORADOL) 15 MG/ML injection 15 mg (15 mg Intravenous Given 03/30/23 0004)  sodium chloride 0.9 % bolus 500 mL (500 mLs Intravenous New Bag/Given 03/30/23 0012)    ED Course/ Medical Decision Making/ A&P Clinical Course as of 03/30/23 0033  Thu Mar 29, 2023  2207 RUQ abd pain since 7pm; chills since 4pm.  Nausea w NBNB emesis.   [WF]  2210 TG ate a burger and has had some  indigestion since then.  [WF]  2213 4pm last PO [WF]  2215 Watery diarrhea x1 today [WF]    Clinical Course User Index [WF] Gailen Shelter, PA                                 Medical Decision Making Amount and/or Complexity of Data Reviewed Labs: ordered. Radiology: ordered.  Risk OTC drugs. Prescription drug management.   This patient presents to the ED for concern of abd pain, this involves a number of treatment options, and is a complaint that carries with it a moderate-to-high risk of complications and morbidity. A differential diagnosis was considered for the patient's symptoms which is discussed below:   The causes of generalized abdominal pain include but are not limited to AAA, mesenteric ischemia, appendicitis, diverticulitis, DKA, gastritis, gastroenteritis, AMI, nephrolithiasis, pancreatitis, peritonitis, adrenal insufficiency,lead poisoning, iron toxicity, intestinal ischemia, constipation, UTI,SBO/LBO, splenic rupture, biliary disease, IBD, IBS, PUD, or hepatitis. Ectopic pregnancy, ovarian torsion, PID.     Co morbidities: Discussed in HPI   Brief History:  Patient is a 52 year old female with past medical history significant for hypertension, status post cholecystectomy  She presents emergency room today with complaints of right upper quadrant abdominal pain since 7 PM.  She states she developed some chills around 4 PM and developed nausea and had several episodes of nonbloody nonbilious emesis.  She states she has had some indigestion since Thanksgiving when she had a burger.  She states her last p.o. intake was 4 PM today.  She has had some watery diarrhea today but states only 1 episode.  She states she has had some looser stool over the past few weeks to months.  She denies any chest pain or dyspnea.  No syncope or near syncope.  She has some generalized aches and feels generally unwell.  She denies any cough and states that her abdominal pain is not worse with  deep inhalation.  She denies any lower extremity swelling or history of VTE no hemoptysis.    EMR reviewed including pt PMHx, past surgical history and past visits to ER.   See HPI for more details   Lab Tests:   I ordered and independently interpreted labs. Labs notable for CBC with mild leukocytosis 11.5, no anemia, CMP with mild hypokalemia likely from emesis.  Lipase within normal limits urinalysis with few bacteria negative for new nitrites or leukocytes some ketones present likely from vomiting and poor p.o. intake.  Urine pregnancy negative   Imaging  Studies:  NAD. I personally reviewed all imaging studies and no acute abnormality found. I agree with radiology interpretation.    Cardiac Monitoring:  The patient was maintained on a cardiac monitor.  I personally viewed and interpreted the cardiac monitored which showed an underlying rhythm of: sinus tachycardia improved to NSR    Medicines ordered:  I ordered medication including lactated Ringer's 1.5 L, Toradol, Zofran, Pepcid, Reglan for abdominal pain and cramping fatigue and nausea vomiting Reevaluation of the patient after these medicines showed that the patient improved I have reviewed the patients home medicines and have made adjustments as needed   Critical Interventions:     Consults/Attending Physician      Reevaluation:  After the interventions noted above I re-evaluated patient and found that they have :improved   Social Determinants of Health:      Problem List / ED Course:  Nausea vomiting diarrhea abdominal pain.  Her symptoms seem to come on relatively quickly and certainly could be consistent with viral gastroenteritis.  However with leukocytosis and some right lower quadrant Donnell tenderness I did obtain CT imaging to evaluate for acute appendicitis which is not present on CT imaging.  Patient is tolerating p.o. Gatorade now.  Vital signs of normalized.  I recommend close outpatient  follow-up.  With strict return precautions to the emergency room.  She is agreeable to plan.  Will discharge home at this time   Dispostion:  After consideration of the diagnostic results and the patients response to treatment, I feel that the patent would benefit from outpatient follow-up.  Patient understands the importance of close monitoring of her symptoms will discharge home with Pepcid, Bentyl, Zofran, Tylenol.  Hydration, rest, close monitoring of symptoms is key.  She has family members who are able to keep an eye on her.   Final Clinical Impression(s) / ED Diagnoses Final diagnoses:  Generalized abdominal pain  RUQ abdominal pain  Ovarian mass, left    Rx / DC Orders ED Discharge Orders          Ordered    dicyclomine (BENTYL) 20 MG tablet  2 times daily        03/30/23 0029    ondansetron (ZOFRAN-ODT) 4 MG disintegrating tablet  Every 8 hours PRN        03/30/23 0029    famotidine (PEPCID) 20 MG tablet  Daily        03/30/23 0029    acetaminophen (TYLENOL) 500 MG tablet  Every 6 hours PRN        03/30/23 0029              Gailen Shelter, PA 03/30/23 0037    Tilden Fossa, MD 03/31/23 [phone]

## 2023-03-29 NOTE — ED Notes (Signed)
Pt back from CT

## 2023-03-30 MED ORDER — DICYCLOMINE HCL 20 MG PO TABS: 20.0000 mg | ORAL_TABLET | Freq: Two times a day (BID) | ORAL | 0 refills | Status: AC

## 2023-03-30 MED ORDER — ACETAMINOPHEN 500 MG PO TABS: 1000.0000 mg | ORAL_TABLET | Freq: Four times a day (QID) | ORAL | 0 refills | Status: AC | PRN

## 2023-03-30 MED ORDER — ONDANSETRON 4 MG PO TBDP: 4.0000 mg | ORAL_TABLET | Freq: Three times a day (TID) | ORAL | 0 refills | Status: AC | PRN

## 2023-03-30 MED ORDER — FAMOTIDINE 20 MG PO TABS: 20.0000 mg | ORAL_TABLET | Freq: Every day | ORAL | 0 refills | Status: AC

## 2023-03-30 NOTE — Discharge Instructions (Addendum)
Take Tylenol 1000 mg every 6 hours  Your CT scan showed a left ovarian cyst otherwise no remarkable findings apart from kidney stones which are inside the kidney and do not cause symptoms when they are in this location.  Take Bentyl as prescribed, take Zofran every 6 hours tomorrow while you are awake after this take as needed for nausea.  Drink plenty of water, Pedialyte, Gatorade and other fluids.  Make sure every few hours you take for deep breaths this will help prevent you from developing a pneumonia while you are resting and at home.  Return the emergency room for any difficulty breathing, any other new or concerning symptoms.  Take Pepcid as prescribed as well.

## 2023-03-30 NOTE — ED Notes (Signed)
Pt tolerated PO challenge

## 2023-03-30 NOTE — ED Notes (Signed)
Reviewed D/C information with the patient, pt verbalized understanding. No additional concerns at this time.

## 2023-03-30 NOTE — ED Notes (Signed)
Pt provided gatorade for PO challenge
